# Patient Record
Sex: Male | Born: 1980 | Race: White | Hispanic: No | Marital: Married | Smoking: Never smoker
Health system: Southern US, Community
[De-identification: ages and names within clinical notes are randomized; demographics above are authoritative.]

## PROBLEM LIST (undated history)

## (undated) DIAGNOSIS — F329 Major depressive disorder, single episode, unspecified: Secondary | ICD-10-CM

## (undated) DIAGNOSIS — F32A Depression, unspecified: Secondary | ICD-10-CM

## (undated) HISTORY — PX: MYRINGOTOMY WITH TUBE PLACEMENT: SHX5663

---

## 2014-10-09 ENCOUNTER — Emergency Department
Admission: EM | Admit: 2014-10-09 | Discharge: 2014-10-09 | Disposition: A | Discharge status: Home / Self Care | Payer: 59 | Source: Home / Self Care | Attending: Family Medicine | Admitting: Family Medicine

## 2014-10-09 ENCOUNTER — Encounter: Payer: Self-pay | Admitting: *Deleted

## 2014-10-09 DIAGNOSIS — L551 Sunburn of second degree: Secondary | ICD-10-CM

## 2014-10-09 DIAGNOSIS — L550 Sunburn of first degree: Secondary | ICD-10-CM | POA: Diagnosis not present

## 2014-10-09 MED ORDER — SILVER SULFADIAZINE 1 % EX CREA
TOPICAL_CREAM | Freq: Once | CUTANEOUS | Status: DC
Start: 2014-10-09 — End: 2014-10-09

## 2014-10-09 MED ORDER — HYDROCODONE-ACETAMINOPHEN 5-325 MG PO TABS: 1.0000 | ORAL_TABLET | Freq: Four times a day (QID) | ORAL | Status: DC | PRN

## 2014-10-09 MED ORDER — SILVER SULFADIAZINE 1 % EX CREA: 1.0000 "application " | TOPICAL_CREAM | Freq: Every day | CUTANEOUS | Status: DC

## 2014-10-09 NOTE — ED Provider Notes (Signed)
CSN: [id number]     Arrival date & time 10/09/14  1513 History   First MD Initiated Contact with Patient 10/09/14 1514     Chief Complaint  Patient presents with  . Sunburn   (Consider location/radiation/quality/duration/timing/severity/associated sxs/prior Treatment) HPI Patient is a 34 year old male presenting to urgent care with complaints of gradually worsening sunburn to right lower leg that started 2 days ago.  [place], but did not apply it properly.  Patient reports aching, burning sensation that is moderate in severity.  Worse with palpation.  Patient notes 2 large blisters to his right lower leg that have not been bleeding or draining.  He has not tried any medications or symptoms.  Patient also notes first-degree burns on bilateral arms and left lower leg.  Denies any other blisters.  Denies fevers, chills, nausea or vomiting.  History reviewed. No pertinent past medical history. Past Surgical History  Procedure Laterality Date  . Myringotomy with tube placement     History reviewed. No pertinent family history. History  Substance Use Topics  . Smoking status: Never Smoker   . Smokeless tobacco: Not on file  . Alcohol Use: Yes    Review of Systems  Constitutional: Negative for fever and chills.  Respiratory: Negative for cough and shortness of breath.   Gastrointestinal: Negative for nausea and vomiting.  Musculoskeletal: Positive for myalgias. Negative for joint swelling and arthralgias.       Right lower leg  Skin: Positive for color change, rash and wound.    Allergies  Review of patient's allergies indicates no known allergies.  Home Medications   Prior to Admission medications   Medication Sig Start Date End Date Taking? Authorizing Provider  HYDROcodone-acetaminophen (NORCO/VICODIN) 5-325 MG per tablet Take 1 tablet by mouth every 6 (six) hours as needed. 10/09/14   Junius FinnerErin O'Malley, PA-C  silver  sulfADIAZINE (SILVADENE) 1 % cream Apply 1 application topically daily. 10/09/14   Junius FinnerErin O'Malley, PA-C   BP 110/73 mmHg  Pulse 75  Temp(Src) 98.2 F (36.8 C) (Oral)  Resp 16  Ht 5\' 8"  (1.727 m)  Wt 175 lb (79.379 kg)  BMI 26.61 kg/m2  SpO2 98% Physical Exam  Constitutional: He is oriented to person, place, and time. He appears well-developed and well-nourished.  HENT:  Head: Normocephalic and atraumatic.  Eyes: EOM are normal.  Neck: Normal range of motion.  Cardiovascular: Normal rate.   Pulmonary/Chest: Effort normal.  Musculoskeletal: Normal range of motion.  Neurological: He is alert and oriented to person, place, and time.  Skin: Skin is warm and dry. There is erythema.  First degree sunburn to bilateral forearms and bilateral lower legs. Right lower leg, second degree burn with 2 bullae. No active bleeding or discharge.  Psychiatric: He has a normal mood and affect. His behavior is normal.  Nursing note and vitals reviewed.   ED Course  Procedures (including critical care time) Labs Review Labs Reviewed - No data to display  Imaging Review No results found.   MDM   1. Second degree sunburn   2. First degree sunburn     Patient presenting to urgent care with second-degree sunburn to right lower leg.  Blister is intact.  Will leave in tact and treat with Silvadene cream.  Home care instructions provided.  Advised to follow-up for wound recheck in 3-4 days. Work note for 2 days provided as pt states he is on his feet often walking.  Encouraged to keep wound covered and elevate foot to help with swelling.  Rx: Silvadene cream and Norco. Patient counseled on use of narcotic pain medications. Counseled not to combine these medications with others containing tylenol. Urged not to drink alcohol, drive, or perform any other activities that requires focus while taking these medications.   Patient verbalized understanding and agreement with treatment plan.    Junius Finner, PA-C 10/09/14 1614

## 2014-10-09 NOTE — ED Notes (Signed)
Devin Phillips reports sunburn x 2 days ago, with blisters on his RLE x 1 day.

## 2014-10-09 NOTE — Discharge Instructions (Signed)
Vicodin is a narcotic pain medication, do not combine these medications with others containing tylenol. While taking, do not drink alcohol, drive, or perform any other activities that requires focus while taking these medications.  Please keep area clean area gently with soap and water, pat dry then apply Silvadene cream and new dry bandage.  You may change the bandage once daily, more frequently if it gets dirty or wet. See below for further instructions.

## 2017-12-24 ENCOUNTER — Emergency Department
Admission: EM | Admit: 2017-12-24 | Discharge: 2017-12-24 | Disposition: A | Discharge status: Home / Self Care | Payer: 59 | Source: Home / Self Care | Attending: Family Medicine | Admitting: Family Medicine

## 2017-12-24 ENCOUNTER — Emergency Department (INDEPENDENT_AMBULATORY_CARE_PROVIDER_SITE_OTHER): Payer: 59

## 2017-12-24 ENCOUNTER — Encounter: Payer: Self-pay | Admitting: *Deleted

## 2017-12-24 DIAGNOSIS — M8588 Other specified disorders of bone density and structure, other site: Secondary | ICD-10-CM

## 2017-12-24 DIAGNOSIS — S39012A Strain of muscle, fascia and tendon of lower back, initial encounter: Secondary | ICD-10-CM

## 2017-12-24 HISTORY — DX: Major depressive disorder, single episode, unspecified: F32.9

## 2017-12-24 HISTORY — DX: Depression, unspecified: F32.A

## 2017-12-24 MED ORDER — LIDOCAINE 5 % EX PTCH: MEDICATED_PATCH | CUTANEOUS | 0 refills | Status: AC

## 2017-12-24 NOTE — ED Triage Notes (Signed)
Patient c/o low back pain that started after doing a barbell squat 2 days ago. Taken IBF otc. Pain does not radiate. No previous injuries.

## 2017-12-24 NOTE — Discharge Instructions (Signed)
Apply ice pack for 20 to 30 minutes, 3 to 4 times daily  Continue until pain and swelling decrease.  May take Ibuprofen 200mg, 4 tabs every 8 hours with food.  Begin range of motion and stretching exercises as tolerated. °

## 2017-12-24 NOTE — ED Provider Notes (Signed)
[facility]    CSN: [id number] Arrival date & time: 12/24/17  1141     History   Chief Complaint Chief Complaint  Patient presents with  . Back Pain    HPI Devin Phillips is a 37 y.o. male.   While doing barbell squats two days ago patient felt sudden low back pain.  The pain has persisted and is worse when bending over.   He denies bowel or bladder dysfunction, and no saddle numbness.  The pain does not radiate.  The history is provided by the patient.  Back Pain  Location:  Lumbar spine Quality:  Aching Radiates to:  Does not radiate Pain severity:  Moderate Pain is:  Worse during the day Onset quality:  Sudden Duration:  2 days Timing:  Constant Progression:  Unchanged Chronicity:  New Context: lifting heavy objects   Relieved by:  Nothing Worsened by:  Bending Ineffective treatments:  NSAIDs Associated symptoms: no abdominal pain, no abdominal swelling, no bladder incontinence, no bowel incontinence, no chest pain, no dysuria, no fever, no leg pain, no numbness, no paresthesias, no pelvic pain, no perianal numbness, no tingling, no weakness and no weight loss     Past Medical History:  Diagnosis Date  . Depression     There are no active problems to display for this patient.   Past Surgical History:  Procedure Laterality Date  . MYRINGOTOMY WITH TUBE PLACEMENT         Home Medications    Prior to Admission medications   Medication Sig Start Date End Date Taking? Authorizing Provider  Brexpiprazole (REXULTI) 1 MG TABS Take by mouth.   Yes [provider]  desvenlafaxine (PRISTIQ) 50 MG 24 hr tablet Take 50 mg by mouth daily.   Yes [provider]  lisdexamfetamine (VYVANSE) 60 MG capsule Take 60 mg by mouth every morning.   Yes [provider]  lidocaine (LIDODERM) 5 % Apply one patch daily. Remove & Discard patch within 12 hours 12/24/17   Lattie Haw, MD    Family History History reviewed. No pertinent  family history.  Social History Social History   Tobacco Use  . Smoking status: Never Smoker  . Smokeless tobacco: Never Used  Substance Use Topics  . Alcohol use: Yes  . Drug use: No     Allergies   Patient has no known allergies.   Review of Systems Review of Systems  Constitutional: Negative for fever and weight loss.  Cardiovascular: Negative for chest pain.  Gastrointestinal: Negative for abdominal pain and bowel incontinence.  Genitourinary: Negative for bladder incontinence, dysuria and pelvic pain.  Musculoskeletal: Positive for back pain.  Neurological: Negative for tingling, weakness, numbness and paresthesias.  All other systems reviewed and are negative.    Physical Exam Triage Vital Signs ED Triage Vitals  Enc Vitals Group     BP 12/24/17 1240 128/89     Pulse Rate 12/24/17 1240 93     Resp --      Temp --      Temp src --      SpO2 12/24/17 1240 99 %     Weight 12/24/17 1241 171 lb (77.6 kg)     Height --      Head Circumference --      Peak Flow --      Pain Score 12/24/17 1241 3     Pain Loc --      Pain Edu? --      Excl. in  GC? --    No data found.  Updated Vital Signs BP 128/89 (BP Location: Right Arm)   Pulse 93   Wt 77.6 kg   SpO2 99%   BMI 26.00 kg/m   Visual Acuity Right Eye Distance:   Left Eye Distance:   Bilateral Distance:    Right Eye Near:   Left Eye Near:    Bilateral Near:     Physical Exam  Constitutional: He appears well-developed and well-nourished. No distress.  HENT:  Head: Normocephalic.  Right Ear: External ear normal.  Left Ear: External ear normal.  Nose: Nose normal.  Mouth/Throat: Oropharynx is clear and moist.  Eyes: Pupils are equal, round, and reactive to light.  Neck: Normal range of motion.  Cardiovascular: Normal heart sounds.  Pulmonary/Chest: Breath sounds normal.  Abdominal: There is no tenderness.  Musculoskeletal: He exhibits no edema.       Back:  Back:  Can heel/toe walk and  squat without difficulty.  Decreased range of motion. Tenderness in the midline and left paraspinous muscles from L1 to L4.  Right paraspinous tenderness from L2 to L3.  Straight leg raising test is negative.  Sitting knee extension test is negative.  Strength and sensation in the lower extremities is normal.  Patellar reflexes are normal.  Right achilles reflex normal and left achilles reflex decreased.   Neurological: He is alert.  Skin: Skin is warm and dry.  Nursing note and vitals reviewed.    UC Treatments / Results  Labs (all labs ordered are listed, but only abnormal results are displayed) Labs Reviewed - No data to display  EKG None  Radiology Dg Lumbar Spine Complete  Result Date: 12/24/2017 CLINICAL DATA:  Full and lower back. EXAM: LUMBAR SPINE - COMPLETE 4+ VIEW COMPARISON:  No recent prior. FINDINGS: Mild lumbar scoliosis concave right. Diffuse osteopenia. No acute bony abnormality identified. No evidence of fracture. IMPRESSION: Mild lumbar scoliosis concave right. Diffuse osteopenia. No acute bony abnormality. Electronically Signed   By: Maisie Fus  Register   On: 12/24/2017 13:53    Procedures Procedures (including critical care time)  Medications Ordered in UC Medications - No data to display  Initial Impression / Assessment and Plan / UC Course  I have reviewed the triage vital signs and the nursing notes.  Pertinent labs & imaging results that were available during my care of the patient were reviewed by me and considered in my medical decision making (see chart for details).    Note incidental finding of osteopenia on lumbar spine x-rays; recommend follow-up with PCP for further evaluation. Optional trial of lidocaine patch to low back for 12 hours daily. Followup with Dr. Rodney Langton or Dr. Clement[facility] ([facility]) if back pain not improving about two weeks.    Final Clinical Impressions(s) / UC Diagnoses   Final diagnoses:  Strain of  lumbar region, initial encounter     Discharge Instructions     Apply ice pack for 20 to 30 minutes, 3 to 4 times daily  Continue until pain and swelling decrease.  May take Ibuprofen 200mg , 4 tabs every 8 hours with food.  Begin range of motion and stretching exercises as tolerated.    ED Prescriptions    Medication Sig Dispense Auth. Provider   lidocaine (LIDODERM) 5 % Apply one patch daily. Remove & Discard patch within 12 hours 12 patch Lattie Haw, MD         Lattie Haw, MD 12/24/17 [phone]

## 2019-05-15 IMAGING — DX DG LUMBAR SPINE COMPLETE 4+V
5 series · 5 of 5 positions shown · non-contrast
Comparison: No recent prior.

CLINICAL DATA: Full and lower back.

EXAM:
LUMBAR SPINE - COMPLETE 4+ VIEW

[l-spine ap]
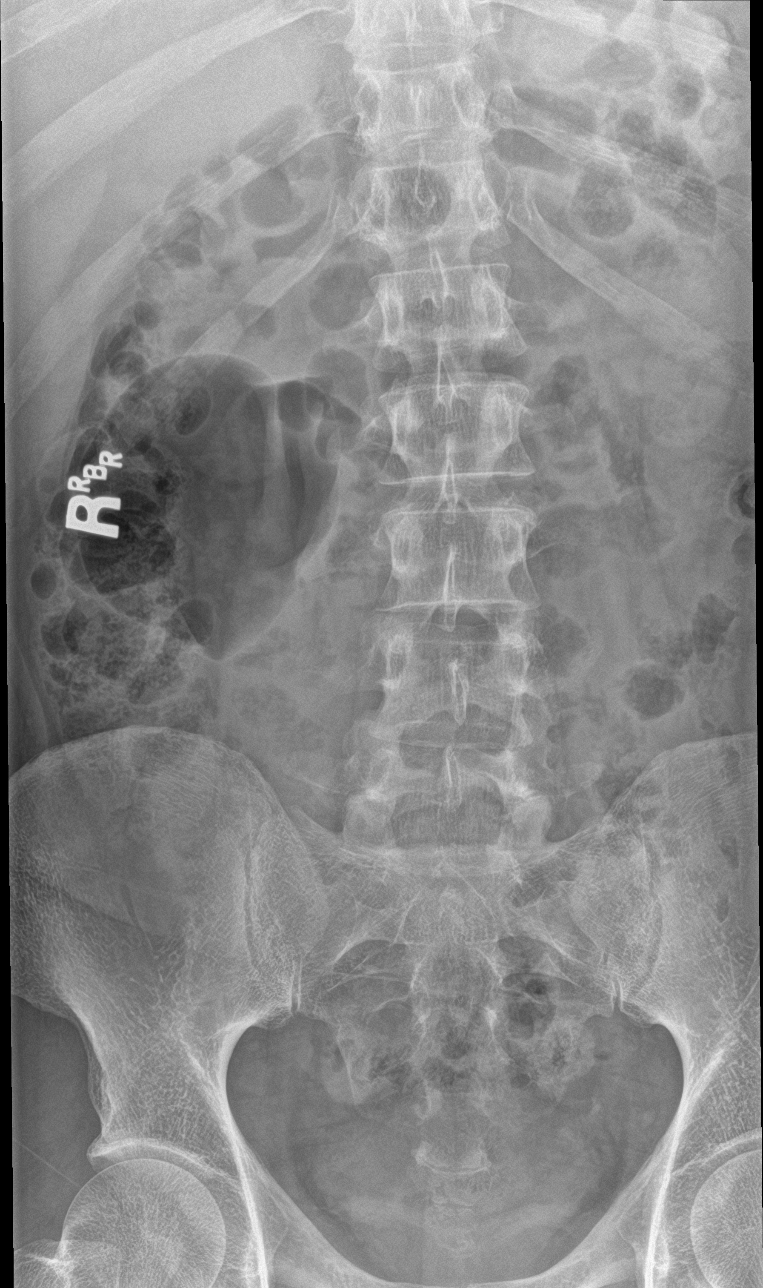

[l-spine obl (1 of 2)]
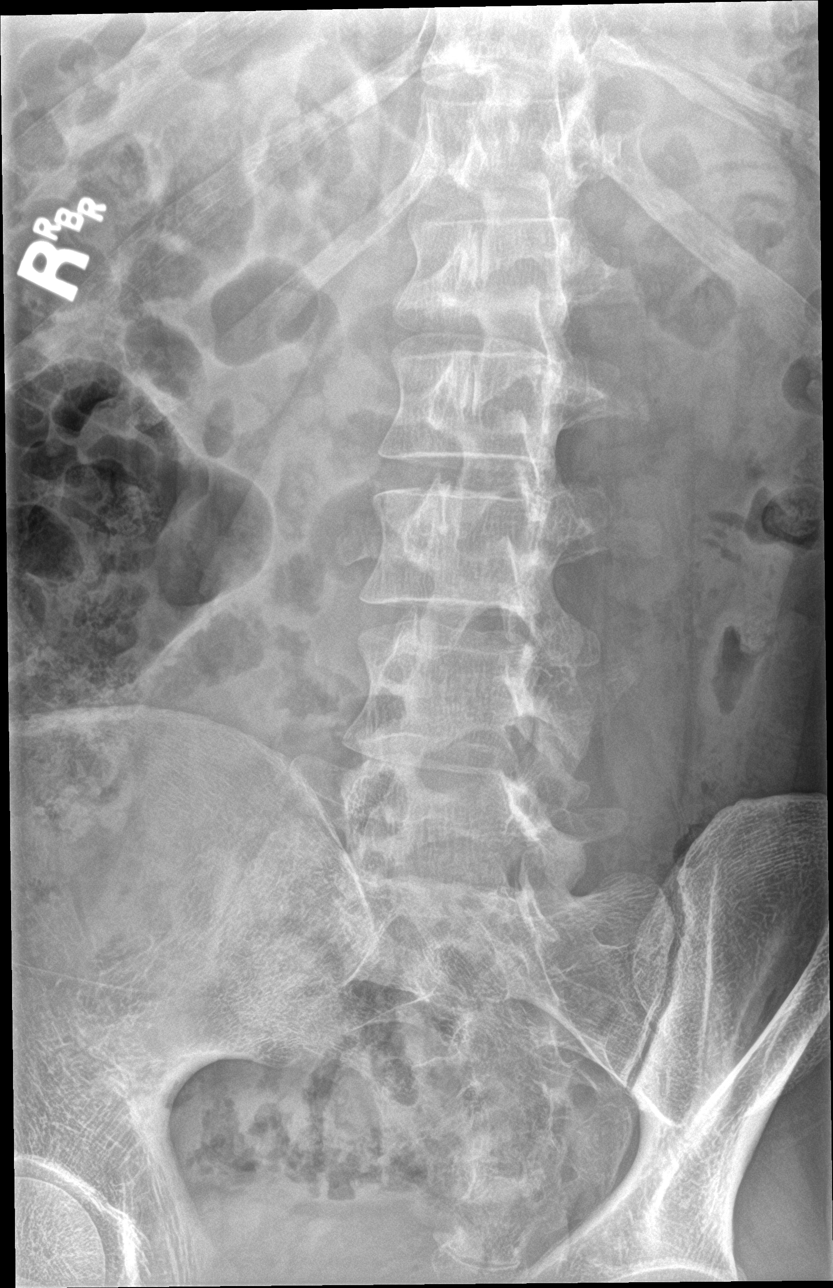

[l-spine obl (2 of 2)]
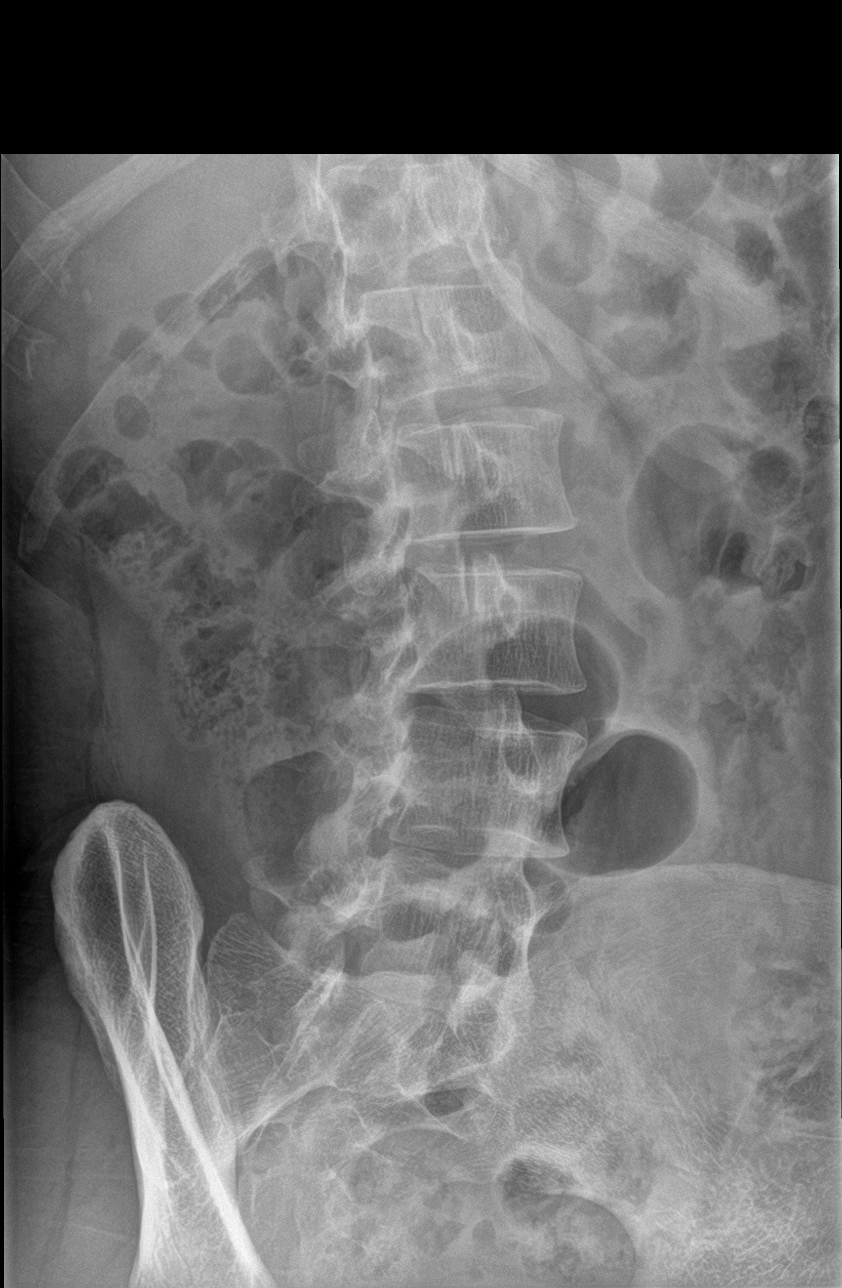

[l-spine lat]
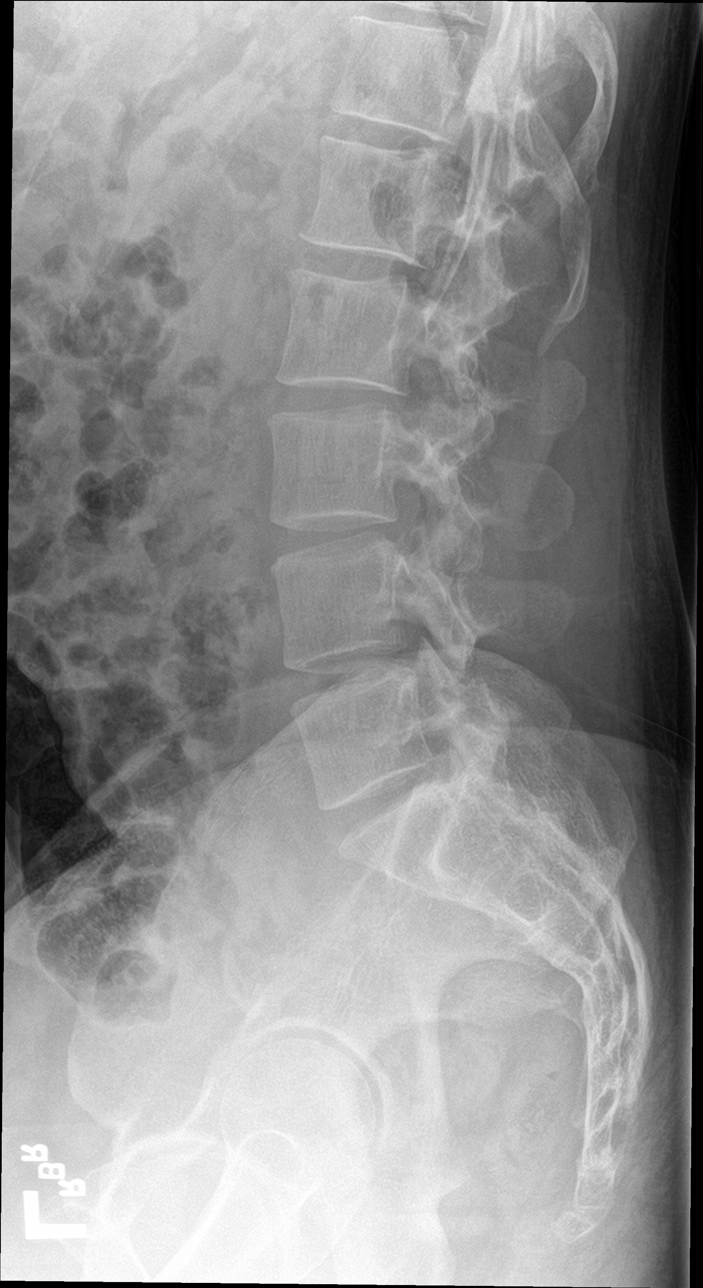

[l-spine spot]
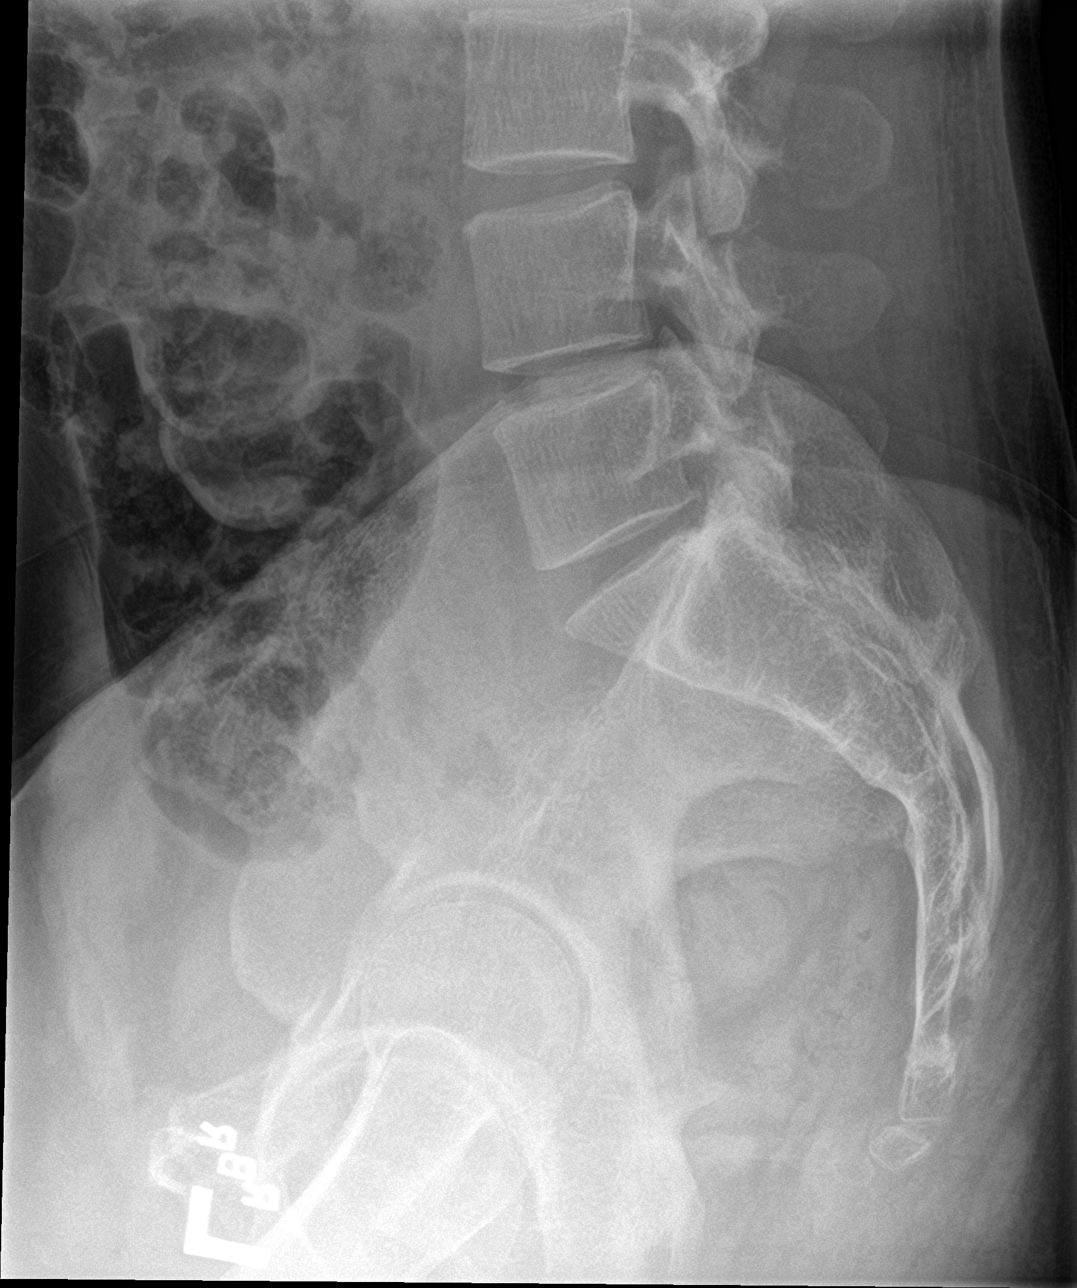

[5 of 5 positions shown; findings below may reference images not displayed]

FINDINGS: Mild lumbar scoliosis concave right. Diffuse osteopenia. No acute
bony abnormality identified. No evidence of fracture.
IMPRESSION: Mild lumbar scoliosis concave right. Diffuse osteopenia. No acute
bony abnormality.
